# Patient Record
Sex: Female | Born: 1985 | Race: White | Hispanic: No | Smoking: Never smoker
Health system: Southern US, Community
[De-identification: ages and names within clinical notes are randomized; demographics above are authoritative.]

---

## 2015-07-03 ENCOUNTER — Emergency Department
Admission: EM | Admit: 2015-07-03 | Discharge: 2015-07-03 | Disposition: A | Discharge status: Home / Self Care | Payer: BC Managed Care – PPO | Attending: Emergency Medicine | Admitting: Emergency Medicine

## 2015-07-03 ENCOUNTER — Encounter: Payer: Self-pay | Admitting: Emergency Medicine

## 2015-07-03 ENCOUNTER — Emergency Department: Payer: BC Managed Care – PPO

## 2015-07-03 DIAGNOSIS — R1032 Left lower quadrant pain: Secondary | ICD-10-CM | POA: Insufficient documentation

## 2015-07-03 DIAGNOSIS — R112 Nausea with vomiting, unspecified: Secondary | ICD-10-CM | POA: Insufficient documentation

## 2015-07-03 DIAGNOSIS — R109 Unspecified abdominal pain: Secondary | ICD-10-CM

## 2015-07-03 DIAGNOSIS — N289 Disorder of kidney and ureter, unspecified: Secondary | ICD-10-CM | POA: Diagnosis not present

## 2015-07-03 LAB — URINALYSIS COMPLETE WITH MICROSCOPIC (ARMC ONLY)
BILIRUBIN URINE: NEGATIVE
GLUCOSE, UA: NEGATIVE mg/dL
KETONES UR: NEGATIVE mg/dL
Leukocytes, UA: NEGATIVE
Nitrite: NEGATIVE
Protein, ur: 100 mg/dL — AB
SPECIFIC GRAVITY, URINE: 1.003 — AB (ref 1.005–1.030)
pH: 6 (ref 5.0–8.0)

## 2015-07-03 LAB — COMPREHENSIVE METABOLIC PANEL
ALBUMIN: 4.1 g/dL (ref 3.5–5.0)
ALK PHOS: 62 U/L (ref 38–126)
ALT: 12 U/L — AB (ref 14–54)
AST: 17 U/L (ref 15–41)
Anion gap: 8 (ref 5–15)
BUN: 17 mg/dL (ref 6–20)
CALCIUM: 8.9 mg/dL (ref 8.9–10.3)
CO2: 27 mmol/L (ref 22–32)
CREATININE: 1.46 mg/dL — AB (ref 0.44–1.00)
Chloride: 106 mmol/L (ref 101–111)
GFR calc Af Amer: 55 mL/min — ABNORMAL LOW (ref >60)
GFR calc non Af Amer: 48 mL/min — ABNORMAL LOW (ref >60)
GLUCOSE: 118 mg/dL — AB (ref 65–99)
Potassium: 3.5 mmol/L (ref 3.5–5.1)
SODIUM: 141 mmol/L (ref 135–145)
Total Bilirubin: 0.6 mg/dL (ref 0.3–1.2)
Total Protein: 7.7 g/dL (ref 6.5–8.1)

## 2015-07-03 LAB — CBC
HCT: 40.3 % (ref 35.0–47.0)
Hemoglobin: 13.1 g/dL (ref 12.0–16.0)
MCH: 26.3 pg (ref 26.0–34.0)
MCHC: 32.6 g/dL (ref 32.0–36.0)
MCV: 80.6 fL (ref 80.0–100.0)
PLATELETS: 287 10*3/uL (ref 150–440)
RBC: 5 MIL/uL (ref 3.80–5.20)
RDW: 13.4 % (ref 11.5–14.5)
WBC: 13 10*3/uL — ABNORMAL HIGH (ref 3.6–11.0)

## 2015-07-03 LAB — LIPASE, BLOOD: Lipase: 27 U/L (ref 11–51)

## 2015-07-03 LAB — POCT PREGNANCY, URINE: PREG TEST UR: NEGATIVE

## 2015-07-03 MED ORDER — PROMETHAZINE HCL 25 MG PO TABS
25.0000 mg | ORAL_TABLET | Freq: Once | ORAL | Status: AC
  Administered 2015-07-03: 25 mg via ORAL

## 2015-07-03 MED ORDER — SODIUM CHLORIDE 0.9 % IV BOLUS (SEPSIS)
1000.0000 mL | INTRAVENOUS | Status: AC
  Administered 2015-07-03: 1000 mL via INTRAVENOUS

## 2015-07-03 MED ORDER — PROMETHAZINE HCL 25 MG PO TABS
ORAL_TABLET | ORAL | Status: AC
Start: 2015-07-03 — End: 2015-07-03
  Administered 2015-07-03: 25 mg via ORAL
  Filled 2015-07-03: qty 1

## 2015-07-03 MED ORDER — ONDANSETRON HCL 4 MG PO TABS: ORAL_TABLET | ORAL | Status: AC

## 2015-07-03 MED ORDER — ONDANSETRON HCL 4 MG/2ML IJ SOLN
4.0000 mg | Freq: Once | INTRAMUSCULAR | Status: AC
  Administered 2015-07-03: 4 mg via INTRAVENOUS
  Filled 2015-07-03: qty 2

## 2015-07-03 NOTE — ED Provider Notes (Signed)
[facility] Emergency Department Provider Note  ____________________________________________  Time seen: Approximately 3:16 AM  I have reviewed the triage vital signs and the nursing notes.   HISTORY  Chief Complaint Emesis    HPI Elizabeth Vargas is a 30 y.o. female with a history of obesity but otherwise no significant past medical history who presents with approximately 3 days of persistent vomiting.  She describes it as severe and acute in onset.  Over the last day she has also had sharp stabbing flank pain which she also describes as severe.  It is worse with movement and helped by remaining still.  She denies fever/chills, chest pain, shortness of breath, any other abdominal pain except for the left flank, and she denies dysuria.  Nothing is making her symptoms better and nothing is making them worse.  She continues to try to take by mouth intake but has been vomiting a lot.  She has not had any specific infectious exposures of which she is aware.   History reviewed. No pertinent past medical history.  There are no active problems to display for this patient.   Past Surgical History  Procedure Laterality Date  . Cesarean section      x 2    Current Outpatient Rx  Name  Route  Sig  Dispense  Refill  . ondansetron (ZOFRAN) 4 MG tablet      Take 1-2 tabs by mouth every 8 hours as needed for nausea/vomiting   30 tablet   0     Allergies Other  No family history on file.  Social History Social History  Substance Use Topics  . Smoking status: Never Smoker   . Smokeless tobacco: Never Used  . Alcohol Use: Yes    Review of Systems Constitutional: No fever/chills Eyes: No visual changes. ENT: No sore throat. Cardiovascular: Denies chest pain. Respiratory: Denies shortness of breath. Gastrointestinal: Left flank pain, persistent vomiting, no diarrhea Genitourinary: Negative for dysuria. Musculoskeletal: Negative for back pain. Skin:  Negative for rash. Neurological: Negative for headaches, focal weakness or numbness.  10-point ROS otherwise negative.  ____________________________________________   PHYSICAL EXAM:  VITAL SIGNS: ED Triage Vitals  Enc Vitals Group     BP 07/03/15 0134 133/88 mmHg     Pulse Rate 07/03/15 0134 72     Resp 07/03/15 0134 20     Temp 07/03/15 0134 98.2 F (36.8 C)     Temp Source 07/03/15 0134 Oral     SpO2 07/03/15 0134 97 %     Weight 07/03/15 0134 275 lb (124.739 kg)     Height 07/03/15 0134  (1.702 m)     Head Cir --      Peak Flow --      Pain Score 07/03/15 0135 4     Pain Loc --      Pain Edu? --      Excl. in GC? --     Constitutional: Alert and oriented. Well appearing and in no acute distress. Eyes: Conjunctivae are normal. PERRL. EOMI. Head: Atraumatic. Nose: No congestion/rhinnorhea. Mouth/Throat: Mucous membranes are moist.  Oropharynx non-erythematous. Neck: No stridor.   Cardiovascular: Normal rate, regular rhythm. Grossly normal heart sounds.  Good peripheral circulation. Respiratory: Normal respiratory effort.  No retractions. Lungs CTAB. Gastrointestinal: Soft and nontender to palpation to the epigastrium and right upper quadrant and right lower quadrant.  She has a very small amount of mild tenderness in the left lower quadrant but this seems to be  coming from her left flank.. No distention. No abdominal bruits. Left CVA tenderness. Musculoskeletal: No lower extremity tenderness nor edema.  No joint effusions. Neurologic:  Normal speech and language. No gross focal neurologic deficits are appreciated.  Skin:  Skin is warm, dry and intact. No rash noted. Psychiatric: Mood and affect are normal. Speech and behavior are normal.  ____________________________________________   LABS (all labs ordered are listed, but only abnormal results are displayed)  Labs Reviewed  COMPREHENSIVE METABOLIC PANEL - Abnormal; Notable for the following:    Glucose, Bld  118 (*)    Creatinine, Ser 1.46 (*)    ALT 12 (*)    GFR calc non Af Amer 48 (*)    GFR calc Af Amer 55 (*)    All other components within normal limits  CBC - Abnormal; Notable for the following:    WBC 13.0 (*)    All other components within normal limits  URINALYSIS COMPLETEWITH MICROSCOPIC ([facility] ONLY) - Abnormal; Notable for the following:    Color, Urine STRAW (*)    APPearance CLEAR (*)    Specific Gravity, Urine 1.003 (*)    Hgb urine dipstick 1+ (*)    Protein, ur 100 (*)    Bacteria, UA RARE (*)    Squamous Epithelial / LPF 0-5 (*)    All other components within normal limits  LIPASE, BLOOD  POC URINE PREG, ED  POCT PREGNANCY, URINE   ____________________________________________  EKG  None ____________________________________________  RADIOLOGY   Ct Renal Stone Study  07/03/2015  CLINICAL DATA:  30 year old female with left flank pain EXAM: CT ABDOMEN AND PELVIS WITHOUT CONTRAST TECHNIQUE: Multidetector CT imaging of the abdomen and pelvis was performed following the standard protocol without IV contrast. COMPARISON:  None. FINDINGS: Evaluation of this exam is limited in the absence of intravenous contrast. The visualized lung bases are clear. No intra-abdominal free air or free fluid. The liver, gallbladder, pancreas, spleen, adrenal glands, kidneys, visualized ureters, and urinary bladder appear unremarkable. The uterus is anteverted. An intrauterine device is noted. The ovaries are grossly unremarkable. No evidence of bowel obstruction or inflammation.  Normal appendix. The abdominal aorta and IVC appear grossly unremarkable on this noncontrast study. No portal venous gas identified. There is no adenopathy. Small fat containing umbilical hernia. The osseous structures appear unremarkable. IMPRESSION: No hydronephrosis or nephrolithiasis. Electronically Signed   By: Elgie Collard M.D.   On: 07/03/2015 03:49     ____________________________________________   PROCEDURES  Procedure(s) performed: None  Critical Care performed: No ____________________________________________   INITIAL IMPRESSION / ASSESSMENT AND PLAN / ED COURSE  Pertinent labs & imaging results that were available during my care of the patient were reviewed by me and considered in my medical decision making (see chart for details).  The patient reports no history of renal problems which suggests that her current creatinine of nearly 1.5 with reduced GFR may be the result of volume depletion from her recent vomiting.  We will provide 1 L normal saline bolus.  Additionally, her flank pain may reflect a kidney stone given that she does have some hemoglobin in her urine and we will evaluate with a CT renal stone protocol.  Though IV contrast may provide additional radiographic information, her reduced GFR suggests to me that this would not be beneficial for the patient.  He is in no acute distress at this time but continues to have some persistent nausea.  ----------------------------------------- 5:02 AM on 07/03/2015 -----------------------------------------  Patient feeling better.  Encouraged plenty of PO fluids given acute renal insufficiency.  Encouraged close outpatient follow up.  Patient understands and agreed with the plan.  ____________________________________________  FINAL CLINICAL IMPRESSION(S) / ED DIAGNOSES  Final diagnoses:  Acute left flank pain  Nausea and vomiting  Acute renal insufficiency      NEW MEDICATIONS STARTED DURING THIS VISIT:  Discharge Medication List as of 07/03/2015  5:07 AM    START taking these medications   Details  ondansetron (ZOFRAN) 4 MG tablet Take 1-2 tabs by mouth every 8 hours as needed for nausea/vomiting, Print         Loleta Rose, MD 07/03/15 [phone]

## 2015-07-03 NOTE — ED Notes (Signed)
Patient reports vomiting for 3 days, last approximately 20 minutes prior to arrival (states was foamy and red).  Also reports now her back hurts.

## 2015-07-03 NOTE — Discharge Instructions (Signed)
We believe your symptoms are caused by either a viral infection or possible a bad food exposure.  Either way, since your symptoms have improved, we feel it is safe for you to go home and follow up with your regular doctor.  Please read the included information and stick to a bland diet for the next two days.  Drink plenty of clear fluids, and if you were provided with a prescription, please take it according to the label instructions.    You need to be sure and drink plenty of fluids because your labs suggested mild dehydration.  We gave you IV fluids, but please follow up with your doctor on Monday and possible check some repeat labs to make sure your creatinine has normalized (it was 1.46 today, and should most likely be closer to 1).  If you develop any new or worsening symptoms, including persistent vomiting not controlled with medication, fever greater than 101, severe or worsening abdominal pain, or other symptoms that concern you, please return immediately to the Emergency Department.  Viral Gastroenteritis  Viral gastroenteritis is also known as stomach flu. This condition affects the stomach and intestinal tract. It can cause sudden diarrhea and vomiting. The illness typically lasts 3 to 8 days. Most people develop an immune response that eventually gets rid of the virus. While this natural response develops, the virus can make you quite ill.  CAUSES  Many different viruses can cause gastroenteritis, such as rotavirus or noroviruses. You can catch one of these viruses by consuming contaminated food or water. You may also catch a virus by sharing utensils or other personal items with an infected person or by touching a contaminated surface.  SYMPTOMS  The most common symptoms are diarrhea and vomiting. These problems can cause a severe loss of body fluids (dehydration) and a body salt (electrolyte) imbalance. Other symptoms may include:  Fever.  Headache.  Fatigue.  Abdominal pain. DIAGNOSIS    Your caregiver can usually diagnose viral gastroenteritis based on your symptoms and a physical exam. A stool sample may also be taken to test for the presence of viruses or other infections.  TREATMENT  This illness typically goes away on its own. Treatments are aimed at rehydration. The most serious cases of viral gastroenteritis involve vomiting so severely that you are not able to keep fluids down. In these cases, fluids must be given through an intravenous line (IV).  HOME CARE INSTRUCTIONS  Drink enough fluids to keep your urine clear or pale yellow. Drink small amounts of fluids frequently and increase the amounts as tolerated.  Ask your caregiver for specific rehydration instructions.  Avoid:  Foods high in sugar.  Alcohol.  Carbonated drinks.  Tobacco.  Juice.  Caffeine drinks.  Extremely hot or cold fluids.  Fatty, greasy foods.  Too much intake of anything at one time.  Dairy products until 24 to 48 hours after diarrhea stops. You may consume probiotics. Probiotics are active cultures of beneficial bacteria. They may lessen the amount and number of diarrheal stools in adults. Probiotics can be found in yogurt with active cultures and in supplements.  Wash your hands well to avoid spreading the virus.  Only take over-the-counter or prescription medicines for pain, discomfort, or fever as directed by your caregiver. Do not give aspirin to children. Antidiarrheal medicines are not recommended.  Ask your caregiver if you should continue to take your regular prescribed and over-the-counter medicines.  Keep all follow-up appointments as directed by your caregiver. SEEK IMMEDIATE MEDICAL  CARE IF:  You are unable to keep fluids down.  You do not urinate at least once every 6 to 8 hours.  You develop shortness of breath.  You notice blood in your stool or vomit. This may look like coffee grounds.  You have abdominal pain that increases or is concentrated in one small area (localized).   You have persistent vomiting or diarrhea.  You have a fever.  The patient is a child younger than 3 months, and he or she has a fever.  The patient is a child older than 3 months, and he or she has a fever and persistent symptoms.  The patient is a child older than 3 months, and he or she has a fever and symptoms suddenly get worse.  The patient is a baby, and he or she has no tears when crying. MAKE SURE YOU:  Understand these instructions.  Will watch your condition.  Will get help right away if you are not doing well or get worse. This information is not intended to replace advice given to you by your health care provider. Make sure you discuss any questions you have with your health care provider.  Document Released: 05/29/2005 Document Revised: 08/21/2011 Document Reviewed: 03/15/2011  Elsevier Interactive Patient Education [employer]

## 2017-02-15 IMAGING — CT CT RENAL STONE PROTOCOL
1 of 2 series · 15 of 32 positions shown, 19 images · non-contrast
Comparison: None.

CLINICAL DATA: 29-year-old female with left flank pain

EXAM:
CT ABDOMEN AND PELVIS WITHOUT CONTRAST
TECHNIQUE: Multidetector CT imaging of the abdomen and pelvis was performed
following the standard protocol without IV contrast.

[Series 2: stone standard full · axial · 0.86mm/px · z∈[-922,-468]mm · 15 of 101 slices shown, 19 images]
[im 5/101  soft-tissue]
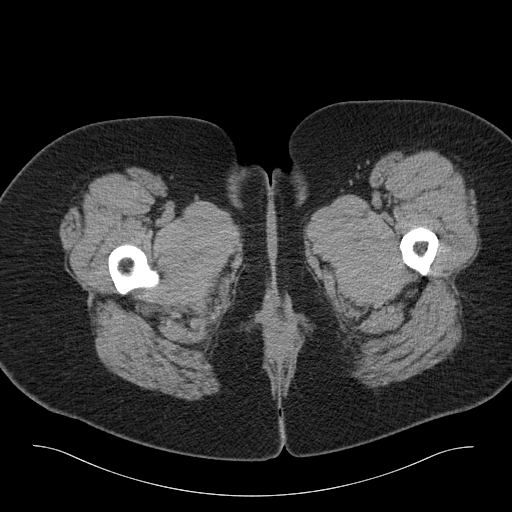
[im 5/101  bone]
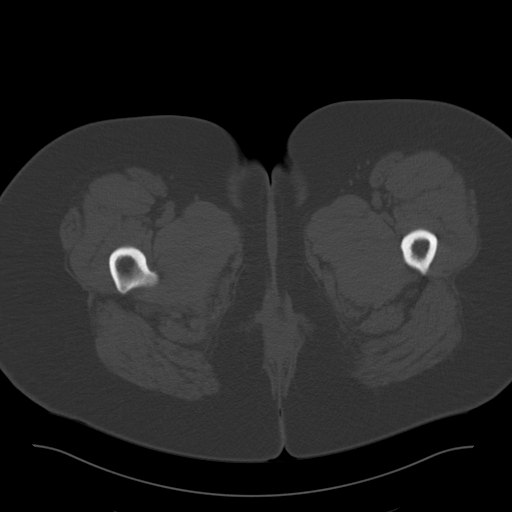
[im 13/101  soft-tissue]
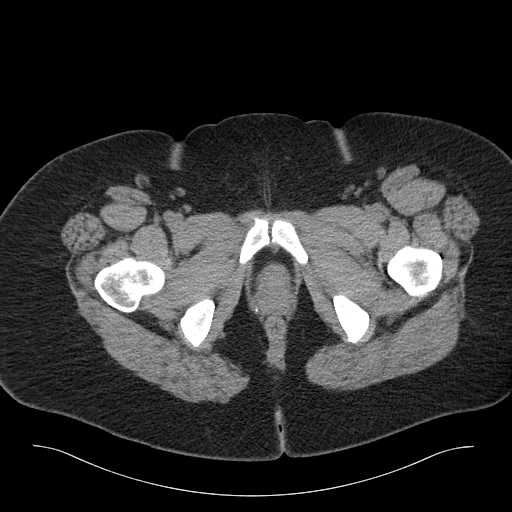
[im 21/101  soft-tissue]
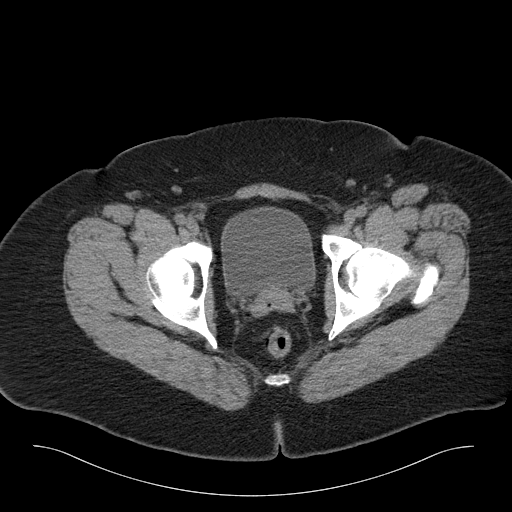
[im 30/101  soft-tissue]
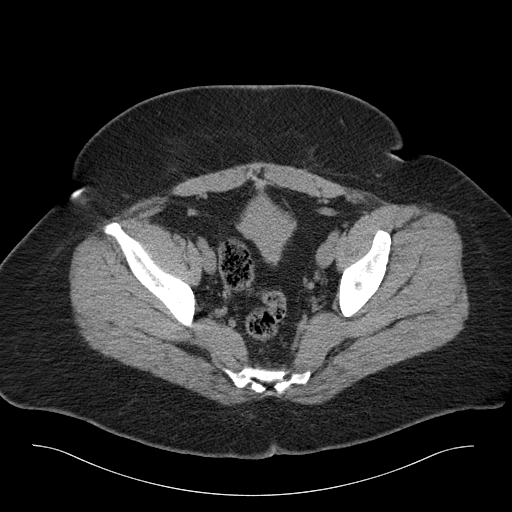
[im 34/101  soft-tissue]
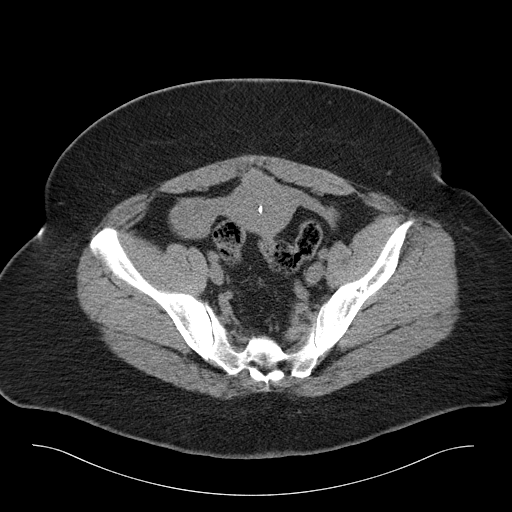
[im 42/101  soft-tissue]
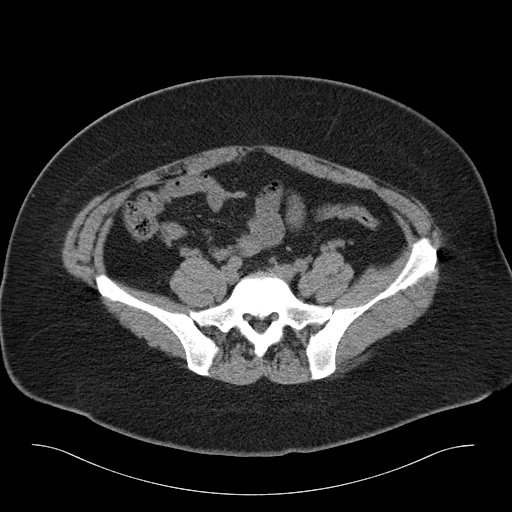
[im 51/101  soft-tissue]
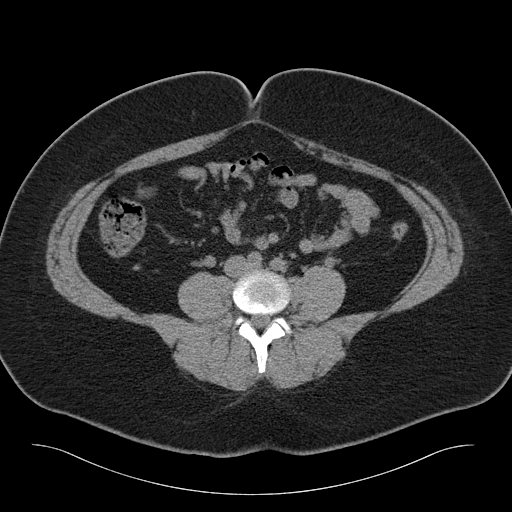
[im 59/101  soft-tissue]
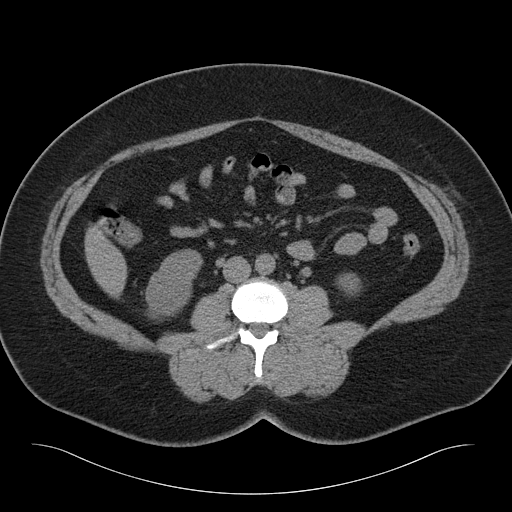
[im 67/101  soft-tissue]
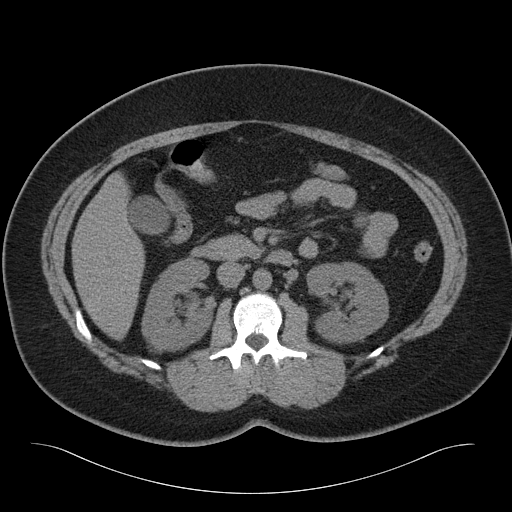
[im 67/101  bone]
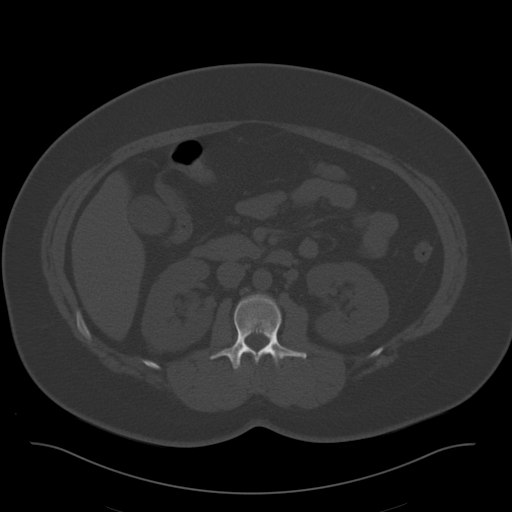
[im 71/101  soft-tissue]
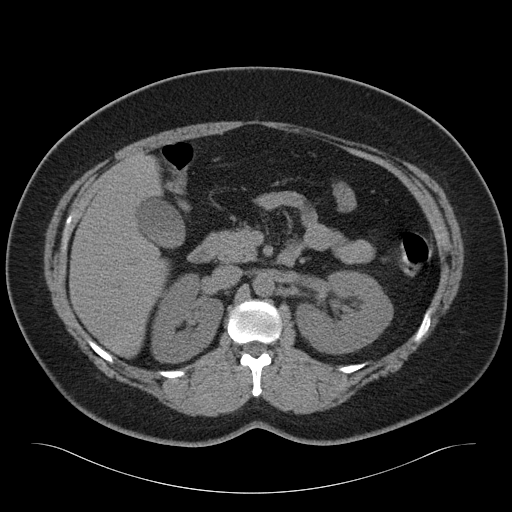
[im 80/101  soft-tissue]
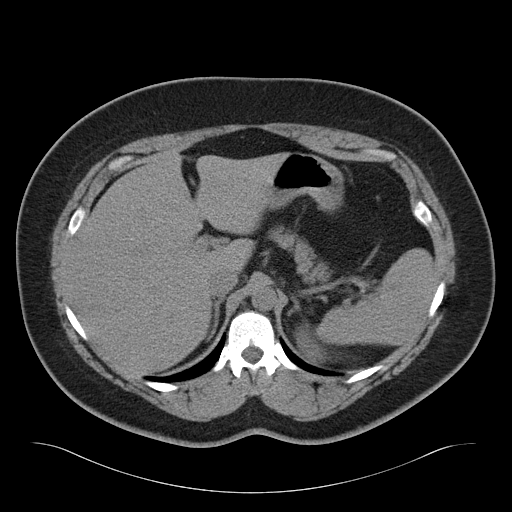
[im 84/101  lung]
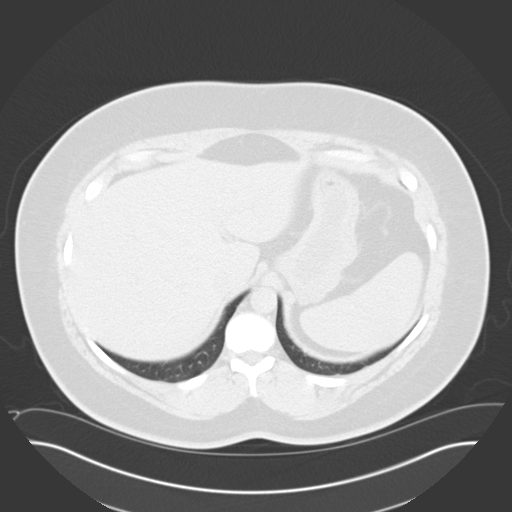
[im 88/101  soft-tissue]
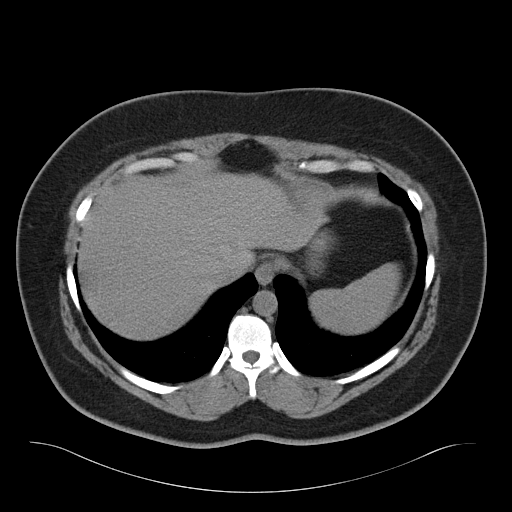
[im 88/101  lung]
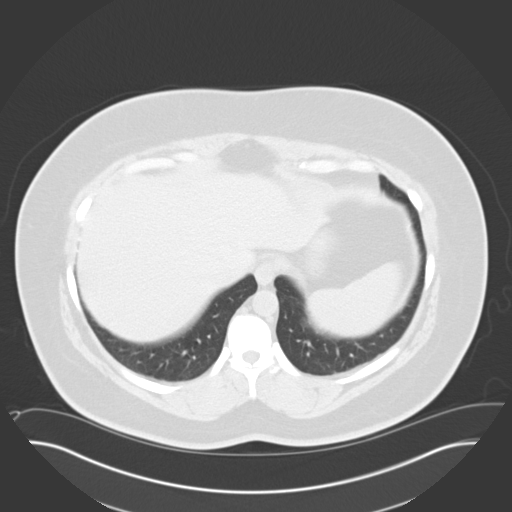
[im 92/101  lung]
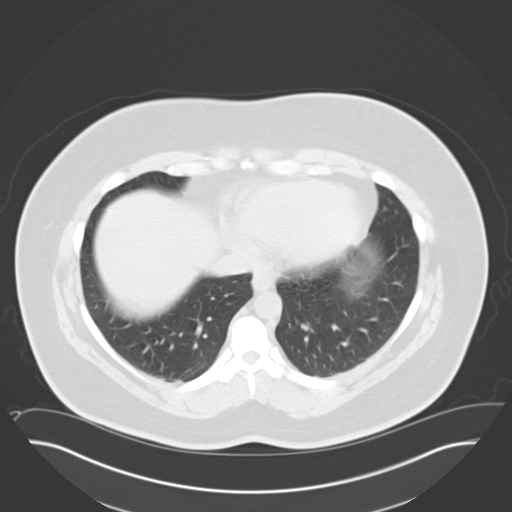
[im 96/101  soft-tissue]
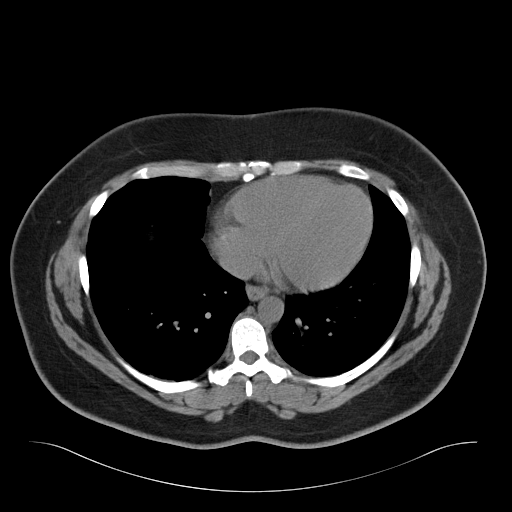
[im 96/101  lung]
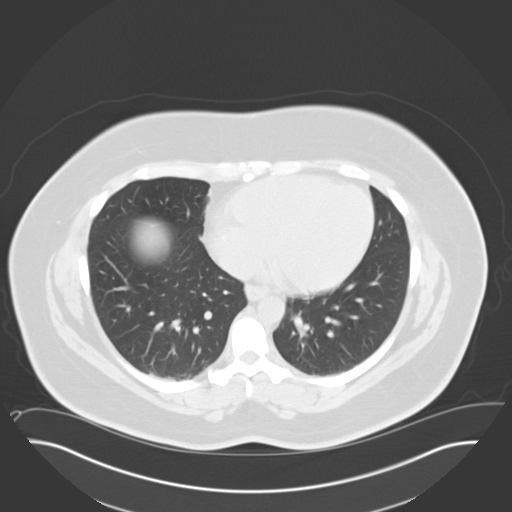

[15 of 32 positions shown; findings below may reference images not displayed]

FINDINGS: Evaluation of this exam is limited in the absence of intravenous
contrast.

The visualized lung bases are clear.

No intra-abdominal free air or free fluid.

The liver, gallbladder, pancreas, spleen, adrenal glands, kidneys,
visualized ureters, and urinary bladder appear unremarkable. The
uterus is anteverted. An intrauterine device is noted. The ovaries
are grossly unremarkable.

No evidence of bowel obstruction or inflammation.  Normal appendix.

The abdominal aorta and IVC appear grossly unremarkable on this
noncontrast study. No portal venous gas identified. There is no
adenopathy. Small fat containing umbilical hernia. The osseous
structures appear unremarkable.
IMPRESSION: No hydronephrosis or nephrolithiasis.
